# Patient Record
Sex: Male | Born: 1990 | Race: Black or African American | Hispanic: No | Marital: Single | Smoking: Never smoker
Health system: Southern US, Community
[De-identification: ages and names within clinical notes are randomized; demographics above are authoritative.]

## PROBLEM LIST (undated history)

## (undated) HISTORY — PX: DENTAL SURGERY: SHX609

---
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
## 2012-12-15 ENCOUNTER — Encounter (HOSPITAL_COMMUNITY): Payer: Self-pay

## 2012-12-15 ENCOUNTER — Emergency Department (HOSPITAL_COMMUNITY)
Admission: EM | Admit: 2012-12-15 | Discharge: 2012-12-15 | Disposition: A | Discharge status: Home / Self Care | Attending: Emergency Medicine | Admitting: Emergency Medicine

## 2012-12-15 ENCOUNTER — Emergency Department (HOSPITAL_COMMUNITY)

## 2012-12-15 DIAGNOSIS — M7650 Patellar tendinitis, unspecified knee: Secondary | ICD-10-CM | POA: Insufficient documentation

## 2012-12-15 DIAGNOSIS — M7651 Patellar tendinitis, right knee: Secondary | ICD-10-CM

## 2012-12-15 MED ORDER — PREDNISONE 50 MG PO TABS: 50.0000 mg | ORAL_TABLET | Freq: Every day | ORAL | Status: DC

## 2012-12-15 MED ORDER — HYDROCODONE-ACETAMINOPHEN 5-325 MG PO TABS: 1.0000 | ORAL_TABLET | Freq: Four times a day (QID) | ORAL | Status: DC | PRN

## 2012-12-15 NOTE — ED Provider Notes (Signed)
  CSN: [id number]     Arrival date & time 12/15/12  1223 History     First MD Initiated Contact with Patient 12/15/12 1238     Chief Complaint  Patient presents with  . Knee Pain   (Consider location/radiation/quality/duration/timing/severity/associated sxs/prior Treatment) HPI Patient present to the emergency department with right knee pain that has been ongoing for quite a while.  Patient, states, that it's been ongoing off, and on for several months.  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  Patient, states, that he has not take any medications prior to arrival.  Patient denies numbness, or weakness in his extremities.  Patient, states, that he has no fever.  Patient, states the pain is mainly with movement and palpation History reviewed. No pertinent past medical history. History reviewed. No pertinent past surgical history. No family history on file. History  Substance Use Topics  . Smoking status: Never Smoker   . Smokeless tobacco: Not on file  . Alcohol Use: No    Review of Systems All other systems negative except as documented in the HPI. All pertinent positives and negatives as reviewed in the HPI. Allergies  Review of patient's allergies indicates no known allergies.  Home Medications  No current outpatient prescriptions on file. BP 124/72  Pulse 66  Temp(Src) 98.6 F (37 C) (Oral)  Resp 20  SpO2 96% Physical Exam  Nursing note and vitals reviewed. Constitutional: He appears well-developed and well-nourished. No distress.  HENT:  Head: Normocephalic and atraumatic.  Pulmonary/Chest: Effort normal.  Musculoskeletal:       Right knee: He exhibits normal range of motion, no swelling, no effusion, no ecchymosis, no deformity, no erythema and no bony tenderness. Tenderness found. Patellar tendon tenderness noted.       Legs:   ED Course   Procedures (including critical care time)  Labs Reviewed - No data to display Dg Knee Complete 4  Views Right  12/15/2012   *RADIOLOGY REPORT*  Clinical Data: Chronic knee pain, worsening recently.  RIGHT KNEE - COMPLETE 4+ VIEW  Comparison: None.  Findings: There is probably a small amount of joint fluid.  There is no joint space narrowing.  No osteophyte or focal lesion.  IMPRESSION: No osseous or articular pathology evident.  Possible small joint effusion.   Original Report Authenticated By: Paulina Fusi, M.D.    Should be referred to orthopedics, and  return here as needed.  He most likely has patellar tendinitis is advised to ice and elevate his knee MDM    Carlyle Dolly, PA-C 12/15/12 1411

## 2012-12-15 NOTE — ED Notes (Addendum)
Rt. Knee pain denies any injury presently.  Pain is intermittent and sometimes the knee gives out. No swelling or deformity noted.  Pain increases after he runs,  He is in the [employer]

## 2012-12-16 NOTE — ED Provider Notes (Signed)
Medical screening examination/treatment/procedure(s) were performed by non-physician practitioner and as supervising physician I was immediately available for consultation/collaboration.    Celene Kras, MD 12/16/12 2118

## 2014-01-13 ENCOUNTER — Emergency Department (HOSPITAL_COMMUNITY)
Admission: EM | Admit: 2014-01-13 | Discharge: 2014-01-13 | Disposition: A | Discharge status: Home / Self Care | Attending: Emergency Medicine | Admitting: Emergency Medicine

## 2014-01-13 ENCOUNTER — Encounter (HOSPITAL_COMMUNITY): Payer: Self-pay | Admitting: Emergency Medicine

## 2014-01-13 DIAGNOSIS — R51 Headache: Secondary | ICD-10-CM | POA: Diagnosis not present

## 2014-01-13 DIAGNOSIS — J029 Acute pharyngitis, unspecified: Secondary | ICD-10-CM | POA: Diagnosis not present

## 2014-01-13 LAB — RAPID STREP SCREEN (MED CTR MEBANE ONLY): Streptococcus, Group A Screen (Direct): NEGATIVE

## 2014-01-13 MED ORDER — ACETAMINOPHEN 325 MG PO TABS
650.0000 mg | ORAL_TABLET | Freq: Once | ORAL | Status: AC
  Administered 2014-01-13: 650 mg via ORAL
  Filled 2014-01-13: qty 2

## 2014-01-13 MED ORDER — PREDNISONE 50 MG PO TABS
60.0000 mg | ORAL_TABLET | Freq: Once | ORAL | Status: AC
  Administered 2014-01-13: 60 mg via ORAL
  Filled 2014-01-13 (×2): qty 1

## 2014-01-13 NOTE — ED Provider Notes (Signed)
CSN: [id number]     Arrival date & time 01/13/14  0154 History   First MD Initiated Contact with Patient 01/13/14 0206     Chief Complaint  Patient presents with  . Sore Throat      Patient is a 23 y.o. male presenting with pharyngitis. The history is provided by the patient.  Sore Throat This is a new problem. The current episode started yesterday. The problem occurs constantly. The problem has been gradually worsening. Associated symptoms include headaches. The symptoms are aggravated by swallowing. The symptoms are relieved by rest.    PMH - none  History  Substance Use Topics  . Smoking status: Never Smoker   . Smokeless tobacco: Not on file  . Alcohol Use: Yes    Review of Systems  Constitutional: Positive for fever and chills.  Respiratory: Negative for cough.   Gastrointestinal: Negative for vomiting and diarrhea.  Neurological: Positive for headaches.      Allergies  Review of patient's allergies indicates no known allergies.  Home Medications   Prior to Admission medications   Not on File   BP 147/75  Pulse 106  Temp(Src) 103.2 F (39.6 C) (Oral)  Resp 18  Ht 6' (1.829 m)  Wt 210 lb (95.255 kg)  BMI 28.47 kg/m2  SpO2 95% Physical Exam CONSTITUTIONAL: Well developed/well nourished HEAD: Normocephalic/atraumatic EYES: EOMI/PERRL ENMT: Mucous membranes moist. Uvula midline.  Tonsil enlarged.  Exudates noted.  Erythema noted.  Normal phonation.  No stridor.  No drooling NECK: supple no meningeal signs. Cervical lymphadenopathy noted CV: S1/S2 noted, no murmurs/rubs/gallops noted LUNGS: Lungs are clear to auscultation bilaterally, no apparent distress ABDOMEN: soft, nontender, no rebound or guarding NEURO: Pt is awake/alert, moves all extremitiesx4 EXTREMITIES: pulses normal, full ROM SKIN: warm, color normal PSYCH: no abnormalities of mood noted  ED Course  Procedures   Strep negative Pt tolerating PO, no distress noted and he is not toxic  appearing Will treat as viral pharyngitis Appropriate for discharge home  Labs Review Labs Reviewed  RAPID STREP SCREEN  CULTURE, GROUP A STREP      MDM   Final diagnoses:  Pharyngitis    Nursing notes including past medical history and social history reviewed and considered in documentation Labs/vital reviewed and considered     Joya Gaskins, MD 01/13/14 [phone]

## 2014-01-13 NOTE — ED Notes (Signed)
Pt reports a sore throat that started on Friday. Pt states he has felt like he has been warm. Pt unable to sleep.

## 2014-01-15 LAB — CULTURE, GROUP A STREP

## 2014-11-17 ENCOUNTER — Emergency Department (HOSPITAL_COMMUNITY)

## 2014-11-17 ENCOUNTER — Emergency Department (HOSPITAL_COMMUNITY)
Admission: EM | Admit: 2014-11-17 | Discharge: 2014-11-17 | Disposition: A | Discharge status: Home / Self Care | Attending: Emergency Medicine | Admitting: Emergency Medicine

## 2014-11-17 ENCOUNTER — Encounter (HOSPITAL_COMMUNITY): Payer: Self-pay | Admitting: Emergency Medicine

## 2014-11-17 DIAGNOSIS — Y9389 Activity, other specified: Secondary | ICD-10-CM | POA: Insufficient documentation

## 2014-11-17 DIAGNOSIS — X58XXXA Exposure to other specified factors, initial encounter: Secondary | ICD-10-CM | POA: Insufficient documentation

## 2014-11-17 DIAGNOSIS — Y998 Other external cause status: Secondary | ICD-10-CM | POA: Diagnosis not present

## 2014-11-17 DIAGNOSIS — Y9289 Other specified places as the place of occurrence of the external cause: Secondary | ICD-10-CM | POA: Insufficient documentation

## 2014-11-17 DIAGNOSIS — S93401A Sprain of unspecified ligament of right ankle, initial encounter: Secondary | ICD-10-CM | POA: Diagnosis not present

## 2014-11-17 DIAGNOSIS — S99911A Unspecified injury of right ankle, initial encounter: Secondary | ICD-10-CM | POA: Diagnosis present

## 2014-11-17 MED ORDER — ACETAMINOPHEN 500 MG PO TABS
1000.0000 mg | ORAL_TABLET | Freq: Once | ORAL | Status: AC
  Administered 2014-11-17: 1000 mg via ORAL
  Filled 2014-11-17: qty 2

## 2014-11-17 MED ORDER — IBUPROFEN 800 MG PO TABS: 800.0000 mg | ORAL_TABLET | Freq: Three times a day (TID) | ORAL | Status: DC

## 2014-11-17 MED ORDER — KETOROLAC TROMETHAMINE 10 MG PO TABS
10.0000 mg | ORAL_TABLET | Freq: Once | ORAL | Status: AC
  Administered 2014-11-17: 10 mg via ORAL
  Filled 2014-11-17: qty 1

## 2014-11-17 NOTE — ED Notes (Signed)
PT c/o right ankle pain with swelling and bruising after overturning it while working outside yesterday. PT able to bear weight to right ankle.

## 2014-11-17 NOTE — ED Provider Notes (Signed)
CSN: [id number]     Arrival date & time 11/17/14  1748 History   This chart was scribed for Ivery Quale, PA-C working with No att. providers found by Elveria Rising, ED Scribe. This patient was seen in room APFT21/APFT21 and the patient's care was started at 6:36 PM.   Chief Complaint  Patient presents with  . Ankle Injury   The history is provided by the patient. No language interpreter was used.   HPI Comments: Aaron Mccullough is a 24 y.o. male who presents to the Emergency Department complaining of right ankle pain and swelling after everting his ankle when working outside yesterday. Patient reports pain especially with bearing weight and ambulation; alleviation at rest. Patient denies additional injuries from his fall. Patient is not on anticoagulants. Patient denies previous surgeries or procedures to ankle.   History reviewed. No pertinent past medical history. Past Surgical History  Procedure Laterality Date  . Dental surgery     History reviewed. No pertinent family history. History  Substance Use Topics  . Smoking status: Never Smoker   . Smokeless tobacco: Not on file  . Alcohol Use: Yes     Comment: occassionally    Review of Systems  Constitutional: Negative for fever.  Musculoskeletal: Positive for joint swelling and arthralgias.  Skin: Negative for wound.  Neurological: Negative for numbness.  All other systems reviewed and are negative.   Allergies  Review of patient's allergies indicates no known allergies.  Home Medications   Prior to Admission medications   Not on File   Triage Vitals: BP 134/78 mmHg  Pulse 60  Temp(Src) 98.7 F (37.1 C) (Oral)  Resp 24  Ht 6' (1.829 m)  Wt 207 lb (93.895 kg)  BMI 28.07 kg/m2  SpO2 99% Physical Exam  Constitutional: He is oriented to person, place, and time. He appears well-developed and well-nourished. No distress.  HENT:  Head: Normocephalic and atraumatic.  Eyes: EOM are normal.  Neck: Neck supple. No  tracheal deviation present.  Cardiovascular: Normal rate.   Pulmonary/Chest: Effort normal. No respiratory distress.  Musculoskeletal: Normal range of motion.  No deformity of antioer tibial area. Achilles tendon is intact. DP and PT are 2+. Capillary refill is <2 seconds. No effusion of the joint on the right ankle. Good ROM of the knee and hip on the right.   Neurological: He is alert and oriented to person, place, and time.  Skin: Skin is warm and dry.  Psychiatric: He has a normal mood and affect. His behavior is normal.  Nursing note and vitals reviewed.   ED Course  Procedures (including critical care time)  COORDINATION OF CARE: 6:44 PM- Will apply stirrup splint to be used over next 10-14 days. Ice, elevation, ibuprofen recommended. Discussed treatment plan with patient at bedside and patient agreed to plan.   Labs Review Labs Reviewed - No data to display  Imaging Review No results found.   EKG Interpretation None      MDM  X-ray of the right ankle is negative for fracture or dislocation. There no neurovascular compromise appreciated. The examination favors an ankle sprain. The patient is fitted with an ankle stirrup splint. He is asked to use ibuprofen 800 mg and extra strength Tylenol 3 times daily. He is also advised to use ice and elevation. He is to see his primary physician, or return to the emergency department if any changes, problems, or concerns.    Final diagnoses:  None    *I have reviewed nursing  notes, vital signs, and all appropriate lab and imaging results for this patient.**  **I personally performed the services described in this documentation, which was scribed in my presence. The recorded information has been reviewed and is accurate.Ivery Quale, PA-C 11/17/14 1853  Eber Hong, MD 11/18/14 [phone]

## 2014-11-17 NOTE — Discharge Instructions (Signed)
Your x-ray is negative for fracture or dislocation. Please use ibuprofen 800 mg, and extra strength Tylenol 3 times daily with food for the next 5-7 days. Please use your ankle stirrup splint for the next 10 days or 2 weeks. Please apply ice and elevate your ankle is much as possible. Ankle Sprain An ankle sprain is an injury to the strong, fibrous tissues (ligaments) that hold the bones of your ankle joint together.  CAUSES An ankle sprain is usually caused by a fall or by twisting your ankle. Ankle sprains most commonly occur when you step on the outer edge of your foot, and your ankle turns inward. People who participate in sports are more prone to these types of injuries.  SYMPTOMS   Pain in your ankle. The pain may be present at rest or only when you are trying to stand or walk.  Swelling.  Bruising. Bruising may develop immediately or within 1 to 2 days after your injury.  Difficulty standing or walking, particularly when turning corners or changing directions. DIAGNOSIS  Your caregiver will ask you details about your injury and perform a physical exam of your ankle to determine if you have an ankle sprain. During the physical exam, your caregiver will press on and apply pressure to specific areas of your foot and ankle. Your caregiver will try to move your ankle in certain ways. An X-ray exam may be done to be sure a bone was not broken or a ligament did not separate from one of the bones in your ankle (avulsion fracture).  TREATMENT  Certain types of braces can help stabilize your ankle. Your caregiver can make a recommendation for this. Your caregiver may recommend the use of medicine for pain. If your sprain is severe, your caregiver may refer you to a surgeon who helps to restore function to parts of your skeletal system (orthopedist) or a physical therapist. HOME CARE INSTRUCTIONS   Apply ice to your injury for 1-2 days or as directed by your caregiver. Applying ice helps to reduce  inflammation and pain.  Put ice in a plastic bag.  Place a towel between your skin and the bag.  Leave the ice on for 15-20 minutes at a time, every 2 hours while you are awake.  Only take over-the-counter or prescription medicines for pain, discomfort, or fever as directed by your caregiver.  Elevate your injured ankle above the level of your heart as much as possible for 2-3 days.  If your caregiver recommends crutches, use them as instructed. Gradually put weight on the affected ankle. Continue to use crutches or a cane until you can walk without feeling pain in your ankle.  If you have a plaster splint, wear the splint as directed by your caregiver. Do not rest it on anything harder than a pillow for the first 24 hours. Do not put weight on it. Do not get it wet. You may take it off to take a shower or bath.  You may have been given an elastic bandage to wear around your ankle to provide support. If the elastic bandage is too tight (you have numbness or tingling in your foot or your foot becomes cold and blue), adjust the bandage to make it comfortable.  If you have an air splint, you may blow more air into it or let air out to make it more comfortable. You may take your splint off at night and before taking a shower or bath. Wiggle your toes in the splint several  times per day to decrease swelling. SEEK MEDICAL CARE IF:   You have rapidly increasing bruising or swelling.  Your toes feel extremely cold or you lose feeling in your foot.  Your pain is not relieved with medicine. SEEK IMMEDIATE MEDICAL CARE IF:  Your toes are numb or blue.  You have severe pain that is increasing. MAKE SURE YOU:   Understand these instructions.  Will watch your condition.  Will get help right away if you are not doing well or get worse. Document Released: 04/12/2005 Document Revised: 01/05/2012 Document Reviewed: 04/24/2011 [facility] Patient Information 2015 [place], [place]. This information is  not intended to replace advice given to you by your health care provider. Make sure you discuss any questions you have with your health care provider.

## 2014-11-17 NOTE — ED Notes (Signed)
Patient with no complaints at this time. Respirations even and unlabored. Skin warm/dry. Discharge instructions reviewed with patient at this time. Patient given opportunity to voice concerns/ask questions. Patient discharged at this time and left Emergency Department with steady gait.   

## 2015-06-09 ENCOUNTER — Emergency Department (HOSPITAL_COMMUNITY)
Admission: EM | Admit: 2015-06-09 | Discharge: 2015-06-09 | Disposition: A | Discharge status: Home / Self Care | Attending: Emergency Medicine | Admitting: Emergency Medicine

## 2015-06-09 ENCOUNTER — Encounter (HOSPITAL_COMMUNITY): Payer: Self-pay | Admitting: *Deleted

## 2015-06-09 DIAGNOSIS — B9789 Other viral agents as the cause of diseases classified elsewhere: Secondary | ICD-10-CM

## 2015-06-09 DIAGNOSIS — M791 Myalgia: Secondary | ICD-10-CM | POA: Diagnosis not present

## 2015-06-09 DIAGNOSIS — J029 Acute pharyngitis, unspecified: Secondary | ICD-10-CM | POA: Insufficient documentation

## 2015-06-09 DIAGNOSIS — J028 Acute pharyngitis due to other specified organisms: Secondary | ICD-10-CM

## 2015-06-09 LAB — RAPID STREP SCREEN (MED CTR MEBANE ONLY): Streptococcus, Group A Screen (Direct): NEGATIVE

## 2015-06-09 MED ORDER — LIDOCAINE VISCOUS 2 % MT SOLN: 10.0000 mL | OROMUCOSAL | Status: AC | PRN

## 2015-06-09 MED ORDER — IBUPROFEN 100 MG/5ML PO SUSP: 800.0000 mg | Freq: Once | ORAL | Status: DC

## 2015-06-09 MED ORDER — IBUPROFEN 800 MG PO TABS
ORAL_TABLET | ORAL | Status: AC
  Administered 2015-06-09: 800 mg via ORAL
  Filled 2015-06-09: qty 1

## 2015-06-09 MED ORDER — IBUPROFEN 800 MG PO TABS
800.0000 mg | ORAL_TABLET | Freq: Once | ORAL | Status: AC
  Administered 2015-06-09: 800 mg via ORAL

## 2015-06-09 MED ORDER — ACETAMINOPHEN 325 MG PO TABS
650.0000 mg | ORAL_TABLET | Freq: Once | ORAL | Status: DC
Start: 2015-06-09 — End: 2015-06-10
  Filled 2015-06-09 (×2): qty 2

## 2015-06-09 NOTE — Discharge Instructions (Signed)

## 2015-06-09 NOTE — ED Notes (Signed)
Pt complaining of throat pain and temp 100, PA aware, orders given

## 2015-06-09 NOTE — ED Notes (Addendum)
Pt  States hewas given tylenol in traige, not ordered or documented in chart. Pt drinking ice water, will wait to recheck vitals

## 2015-06-09 NOTE — ED Notes (Signed)
Patient given discharge instruction, verbalized understand. Patient ambulatory out of the department.  

## 2015-06-09 NOTE — ED Notes (Signed)
Pt comes in with sore throat and nasal congestion starting this morning. Denies n/v/d.

## 2015-06-09 NOTE — ED Notes (Signed)
Headache, sore throat, cough

## 2015-06-10 ENCOUNTER — Emergency Department (HOSPITAL_COMMUNITY)

## 2015-06-10 ENCOUNTER — Encounter (HOSPITAL_COMMUNITY): Payer: Self-pay | Admitting: *Deleted

## 2015-06-10 ENCOUNTER — Emergency Department (HOSPITAL_COMMUNITY)
Admission: EM | Admit: 2015-06-10 | Discharge: 2015-06-11 | Disposition: A | Discharge status: Home / Self Care | Attending: Emergency Medicine | Admitting: Emergency Medicine

## 2015-06-10 DIAGNOSIS — J159 Unspecified bacterial pneumonia: Secondary | ICD-10-CM | POA: Insufficient documentation

## 2015-06-10 DIAGNOSIS — J189 Pneumonia, unspecified organism: Secondary | ICD-10-CM

## 2015-06-10 DIAGNOSIS — J029 Acute pharyngitis, unspecified: Secondary | ICD-10-CM | POA: Diagnosis present

## 2015-06-10 LAB — BASIC METABOLIC PANEL
Anion gap: 8 (ref 5–15)
BUN: 15 mg/dL (ref 6–20)
CHLORIDE: 103 mmol/L (ref 101–111)
CO2: 26 mmol/L (ref 22–32)
Calcium: 9 mg/dL (ref 8.9–10.3)
Creatinine, Ser: 1.28 mg/dL — ABNORMAL HIGH (ref 0.61–1.24)
GFR calc Af Amer: >60 mL/min (ref >60)
GLUCOSE: 108 mg/dL — AB (ref 65–99)
POTASSIUM: 3.2 mmol/L — AB (ref 3.5–5.1)
Sodium: 137 mmol/L (ref 135–145)

## 2015-06-10 LAB — CBC WITH DIFFERENTIAL/PLATELET
Basophils Absolute: 0 10*3/uL (ref 0.0–0.1)
Basophils Relative: 0 %
EOS PCT: 1 %
Eosinophils Absolute: 0.1 10*3/uL (ref 0.0–0.7)
HCT: 40.2 % (ref 39.0–52.0)
Hemoglobin: 12.9 g/dL — ABNORMAL LOW (ref 13.0–17.0)
LYMPHS ABS: 0.9 10*3/uL (ref 0.7–4.0)
LYMPHS PCT: 9 %
MCH: 30.4 pg (ref 26.0–34.0)
MCHC: 32.1 g/dL (ref 30.0–36.0)
MCV: 94.8 fL (ref 78.0–100.0)
MONO ABS: 1.2 10*3/uL — AB (ref 0.1–1.0)
MONOS PCT: 12 %
Neutro Abs: 7.9 10*3/uL — ABNORMAL HIGH (ref 1.7–7.7)
Neutrophils Relative %: 78 %
PLATELETS: 229 10*3/uL (ref 150–400)
RBC: 4.24 MIL/uL (ref 4.22–5.81)
RDW: 12.5 % (ref 11.5–15.5)
WBC: 10.1 10*3/uL (ref 4.0–10.5)

## 2015-06-10 LAB — MONONUCLEOSIS SCREEN: Mono Screen: NEGATIVE

## 2015-06-10 LAB — LACTIC ACID, PLASMA: Lactic Acid, Venous: 0.8 mmol/L (ref 0.5–2.0)

## 2015-06-10 LAB — RAPID STREP SCREEN (MED CTR MEBANE ONLY): Streptococcus, Group A Screen (Direct): NEGATIVE

## 2015-06-10 MED ORDER — DEXTROSE 5 % IV SOLN
1.0000 g | Freq: Once | INTRAVENOUS | Status: AC
  Administered 2015-06-10: 1 g via INTRAVENOUS
  Filled 2015-06-10: qty 10

## 2015-06-10 MED ORDER — SODIUM CHLORIDE 0.9 % IV BOLUS (SEPSIS)
1000.0000 mL | Freq: Once | INTRAVENOUS | Status: AC
  Administered 2015-06-10: 1000 mL via INTRAVENOUS

## 2015-06-10 MED ORDER — GI COCKTAIL ~~LOC~~
30.0000 mL | Freq: Once | ORAL | Status: AC
  Administered 2015-06-10: 30 mL via ORAL
  Filled 2015-06-10: qty 30

## 2015-06-10 MED ORDER — ACETAMINOPHEN 325 MG PO TABS
650.0000 mg | ORAL_TABLET | Freq: Once | ORAL | Status: AC | PRN
  Administered 2015-06-10: 650 mg via ORAL
  Filled 2015-06-10: qty 2

## 2015-06-10 MED ORDER — POTASSIUM CHLORIDE CRYS ER 20 MEQ PO TBCR
20.0000 meq | EXTENDED_RELEASE_TABLET | Freq: Once | ORAL | Status: AC
  Administered 2015-06-10: 20 meq via ORAL
  Filled 2015-06-10: qty 1

## 2015-06-10 NOTE — ED Provider Notes (Signed)
CSN: [id number]     Arrival date & time 06/09/15  1840 History   First MD Initiated Contact with Patient 06/09/15 1952     Chief Complaint  Patient presents with  . Sore Throat     (Consider location/radiation/quality/duration/timing/severity/associated sxs/prior Treatment) The history is provided by the patient.   Aaron Mccullough is a 25 y.o. male presenting with a 1 day history of subjective fever including sweats followed by cold chills, sore throat along with generalized body ache and headache.  He has taken ibuprofen with moderate improvement in pain and fever symptoms.  He denies nasal congestion, ear or sinus pain, shortness of breath, chest pain, cough, nausea, vomiting, diarrhea or abdominal pain.  He has not been exposed to anyone with strep throat to his knowledge.  He received a flu shot this fall through the [employer].    History reviewed. No pertinent past medical history. Past Surgical History  Procedure Laterality Date  . Dental surgery     No family history on file. Social History  Substance Use Topics  . Smoking status: Never Smoker   . Smokeless tobacco: None  . Alcohol Use: Yes     Comment: occassionally    Review of Systems  Constitutional: Positive for fever and chills.  HENT: Positive for sore throat. Negative for congestion, ear pain, rhinorrhea, sinus pressure, trouble swallowing and voice change.   Eyes: Negative for discharge.  Respiratory: Negative for cough, shortness of breath, wheezing and stridor.   Cardiovascular: Negative for chest pain.  Gastrointestinal: Negative for abdominal pain.  Genitourinary: Negative.   Musculoskeletal: Positive for myalgias.  Skin: Negative.       Allergies  Review of patient's allergies indicates no known allergies.  Home Medications   Prior to Admission medications   Medication Sig Start Date End Date Taking? Authorizing Provider  ibuprofen (ADVIL,MOTRIN) 800 MG tablet Take 800 mg by mouth every 8 (eight)  hours as needed. 05/21/15   Historical Provider, MD  lidocaine (XYLOCAINE) 2 % solution Use as directed 10 mLs in the mouth or throat every 3 (three) hours as needed (gargle and spit as needed for mouth or throat pain). 06/09/15   Burgess Amor, PA-C  MAXALT-MLT 10 MG disintegrating tablet Take 10 mg by mouth as needed for migraine.  05/14/15   Historical Provider, MD  traZODone (DESYREL) 50 MG tablet Take 50 mg by mouth at bedtime. 05/14/15   Historical Provider, MD  zolpidem (AMBIEN) 5 MG tablet Take 5 mg by mouth at bedtime. 05/14/15   Historical Provider, MD   BP 154/68 mmHg  Pulse 93  Temp(Src) 100 F (37.8 C) (Oral)  Resp 14  Ht  (1.803 m)  Wt 102.059 kg  BMI 31.39 kg/m2  SpO2 98% Physical Exam  Constitutional: He is oriented to person, place, and time. He appears well-developed and well-nourished.  HENT:  Head: Normocephalic and atraumatic.  Right Ear: Tympanic membrane and ear canal normal.  Left Ear: Tympanic membrane and ear canal normal.  Nose: No mucosal edema or rhinorrhea.  Mouth/Throat: Uvula is midline and mucous membranes are normal. Posterior oropharyngeal erythema present. No oropharyngeal exudate, posterior oropharyngeal edema or tonsillar abscesses.  Eyes: Conjunctivae are normal.  Cardiovascular: Normal rate and normal heart sounds.   Pulmonary/Chest: Effort normal. No respiratory distress. He has no wheezes. He has no rales.  Abdominal: Soft. There is no tenderness.  Musculoskeletal: Normal range of motion.  Neurological: He is alert and oriented to person, place, and time.  Skin: Skin  is warm and dry. No rash noted.  Psychiatric: He has a normal mood and affect.    ED Course  Procedures (including critical care time) Labs Review Labs Reviewed  RAPID STREP SCREEN (NOT AT [facility])  CULTURE, GROUP A STREP [facility])    Imaging Review  I have personally reviewed and evaluated these images and lab results as part of my medical decision-making.   EKG  Interpretation None      MDM   Final diagnoses:  Acute viral pharyngitis    Discussed results with pt, aware culture pending. Also discussed influenza as possible source of sx.  Advised increased fluid intake, motrin/tylenol for throat pain and fever reduction.  Rest. Prn f/u.     Burgess Amor, PA-C 06/10/15 2329  Bethann Berkshire, MD 06/10/15 [phone]

## 2015-06-10 NOTE — ED Notes (Signed)
Pt c/o sore throat x 2 days; pt states he was seen here and was given tylenol;

## 2015-06-11 LAB — CULTURE, GROUP A STREP (THRC)

## 2015-06-11 MED ORDER — AZITHROMYCIN 250 MG PO TABS: ORAL_TABLET | ORAL | Status: AC

## 2015-06-11 NOTE — Discharge Instructions (Signed)

## 2015-06-11 NOTE — ED Provider Notes (Signed)
CSN: [id number]     Arrival date & time 06/10/15  1948 History   First MD Initiated Contact with Patient 06/10/15 2112     Chief Complaint  Patient presents with  . Sore Throat     (Consider location/radiation/quality/duration/timing/severity/associated sxs/prior Treatment) The history is provided by the patient.   Aaron Mccullough is a 25 y.o. male who was seen here yesterday for fever and sore throat returns today for worsened symptoms including worsened sore throat with sensation of fullness with swallowing, fever currently 102.5 (has had no antipyretics today per girlfriend at the bedside), persistent generalized myalgia and fatigue.  He also endorses generalized headache.  He has had no cough, shortness of breath, chest pain, nausea, vomiting, diarrhea or abdominal pain. He has been able to maintain hydration but has had no appetite felt to be related to the pain of swallowing.  He tried using the lidocaine prescribed yesterday for throat pain which was not helpful.  He denies any known exposures to similar illness.  He has received a flu shot this season, requirement of being in the [employer].      History reviewed. No pertinent past medical history. Past Surgical History  Procedure Laterality Date  . Dental surgery     History reviewed. No pertinent family history. Social History  Substance Use Topics  . Smoking status: Never Smoker   . Smokeless tobacco: None  . Alcohol Use: Yes     Comment: occassionally    Review of Systems  Constitutional: Positive for fever, chills and fatigue.  HENT: Positive for sore throat. Negative for congestion.   Eyes: Negative.   Respiratory: Negative for cough, chest tightness, shortness of breath and wheezing.   Cardiovascular: Negative for chest pain.  Gastrointestinal: Negative for nausea, vomiting and abdominal pain.  Genitourinary: Negative.  Negative for dysuria and decreased urine volume.  Musculoskeletal: Positive for myalgias.  Negative for joint swelling, arthralgias and neck pain.  Skin: Negative.  Negative for rash and wound.  Neurological: Negative for dizziness, weakness, light-headedness, numbness and headaches.  Psychiatric/Behavioral: Negative.       Allergies  Review of patient's allergies indicates no known allergies.  Home Medications   Prior to Admission medications   Medication Sig Start Date End Date Taking? Authorizing Provider  ibuprofen (ADVIL,MOTRIN) 800 MG tablet Take 800 mg by mouth every 8 (eight) hours as needed. 05/21/15  Yes Historical Provider, MD  lidocaine (XYLOCAINE) 2 % solution Use as directed 10 mLs in the mouth or throat every 3 (three) hours as needed (gargle and spit as needed for mouth or throat pain). 06/09/15  Yes Raynelle Mccullough Patric Buckhalter, PA-C  MAXALT-MLT 10 MG disintegrating tablet Take 10 mg by mouth as needed for migraine.  05/14/15  Yes Historical Provider, MD  traZODone (DESYREL) 50 MG tablet Take 50 mg by mouth at bedtime. 05/14/15  Yes Historical Provider, MD  zolpidem (AMBIEN) 5 MG tablet Take 5 mg by mouth at bedtime. 05/14/15  Yes Historical Provider, MD  azithromycin (ZITHROMAX Z-PAK) 250 MG tablet Take 2 tablets by mouth on day one followed by one tablet daily for 4 days. 06/11/15   Burgess Amor, PA-C   BP 151/72 mmHg  Pulse 99  Temp(Src) 100.4 F (38 C) (Oral)  Resp 18  Ht 6' (1.829 m)  Wt 102.059 kg  BMI 30.51 kg/m2  SpO2 98% Physical Exam  Constitutional: He appears well-developed and well-nourished.  HENT:  Head: Normocephalic and atraumatic.  Right Ear: Tympanic membrane and ear canal normal.  Left Ear: Tympanic membrane and ear canal normal.  Nose: No mucosal edema or rhinorrhea.  Mouth/Throat: No trismus in the jaw. Posterior oropharyngeal erythema present.  Mild posterior pharyngeal erythema.  No exudate, no edema, uvula midline.  Eyes: Conjunctivae are normal.  Neck: Normal range of motion. Neck supple.  Cardiovascular: Normal rate, regular rhythm, normal heart  sounds and intact distal pulses.   Pulmonary/Chest: Effort normal and breath sounds normal. No respiratory distress. He has no wheezes.  Abdominal: Soft. Bowel sounds are normal. There is no tenderness. There is no guarding.  Musculoskeletal: Normal range of motion.  Lymphadenopathy:    He has no cervical adenopathy.  Neurological: He is alert.  Skin: Skin is warm and dry.  Psychiatric: He has a normal mood and affect.  Nursing note and vitals reviewed.   ED Course  Procedures (including critical care time) Labs Review Labs Reviewed  CBC WITH DIFFERENTIAL/PLATELET - Abnormal; Notable for the following:    Hemoglobin 12.9 (*)    Neutro Abs 7.9 (*)    Monocytes Absolute 1.2 (*)    All other components within normal limits  BASIC METABOLIC PANEL - Abnormal; Notable for the following:    Potassium 3.2 (*)    Glucose, Bld 108 (*)    Creatinine, Ser 1.28 (*)    All other components within normal limits  RAPID STREP SCREEN (NOT AT [facility])  CULTURE, GROUP A STREP ([facility])  LACTIC ACID, PLASMA  MONONUCLEOSIS SCREEN  URINALYSIS, ROUTINE W REFLEX MICROSCOPIC (NOT AT [facility])    Imaging Review Dg Neck Soft Tissue  06/10/2015  CLINICAL DATA:  25 year old male with fever, weakness, body aches and sore throat EXAM: NECK SOFT TISSUES - 1+ VIEW COMPARISON:  Concurrently obtained chest x-ray. FINDINGS: There is no evidence of retropharyngeal soft tissue swelling or epiglottic enlargement. The cervical airway is unremarkable and no radio-opaque foreign body identified. IMPRESSION: Negative. Electronically Signed   By: Malachy Moan M.D.   On: 06/10/2015 22:11   Dg Chest 2 View  06/10/2015  CLINICAL DATA:  25 year old male with fever, weakness, and body aches EXAM: CHEST  2 VIEW COMPARISON:  Prior chest x-ray 12/28/2009 FINDINGS: Faint patchy airspace opacity in the right middle lobe concerning for bronchopneumonia. The heart and mediastinal contours are within normal limits. No pleural effusion,  pulmonary edema or pneumothorax. No acute osseous abnormality. IMPRESSION: Right middle lobe bronchopneumonia. Electronically Signed   By: Malachy Moan M.D.   On: 06/10/2015 22:10   I have personally reviewed and evaluated these images and lab results as part of my medical decision-making.   EKG Interpretation None      MDM   Final diagnoses:  CAP (community acquired pneumonia)   Medications  acetaminophen (TYLENOL) tablet 650 mg (650 mg Oral Given 06/10/15 2017)  sodium chloride 0.9 % bolus 1,000 mL (0 mLs Intravenous Stopped 06/10/15 2251)  cefTRIAXone (ROCEPHIN) 1 g in dextrose 5 % 50 mL IVPB (0 g Intravenous Stopped 06/11/15 0040)  potassium chloride SA (K-DUR,KLOR-CON) CR tablet 20 mEq (20 mEq Oral Given 06/10/15 2254)  gi cocktail (Maalox,Lidocaine,Donnatal) (30 mLs Oral Given 06/10/15 2254)  sodium chloride 0.9 % bolus 1,000 mL (0 mLs Intravenous Stopped 06/11/15 0040)   Pt given tylenol with adequate fever response, GI cocktail with transient relief of sore throat.  Pt given rocephin IV after review of cxr.  Lungs still CTAB. NS 2 liters given , pt tolerated PO intake as well and felt improved at dc.  Discussed need for better control of  fever which will help him feel better as this illness is running its course. Discussed alternating tylenol/motrin q3hours prn.  Increased fluid intake, rest. Prn f//u here for any worsened sx including sob or weakness.  Zpack prescribed.    Discussed with Dr Estell Harpin prior to dc home.    Burgess Amor, PA-C 06/11/15 0105  Bethann Berkshire, MD 06/11/15 [phone]

## 2015-06-13 LAB — CULTURE, GROUP A STREP (THRC)

## 2017-05-11 IMAGING — DX DG CHEST 2V
2 series · 2 of 2 positions shown · non-contrast
Comparison: Prior chest x-ray 12/28/2009

CLINICAL DATA: 24-year-old male with fever, weakness, and body
aches

EXAM:
CHEST  2 VIEW

[chest pa]
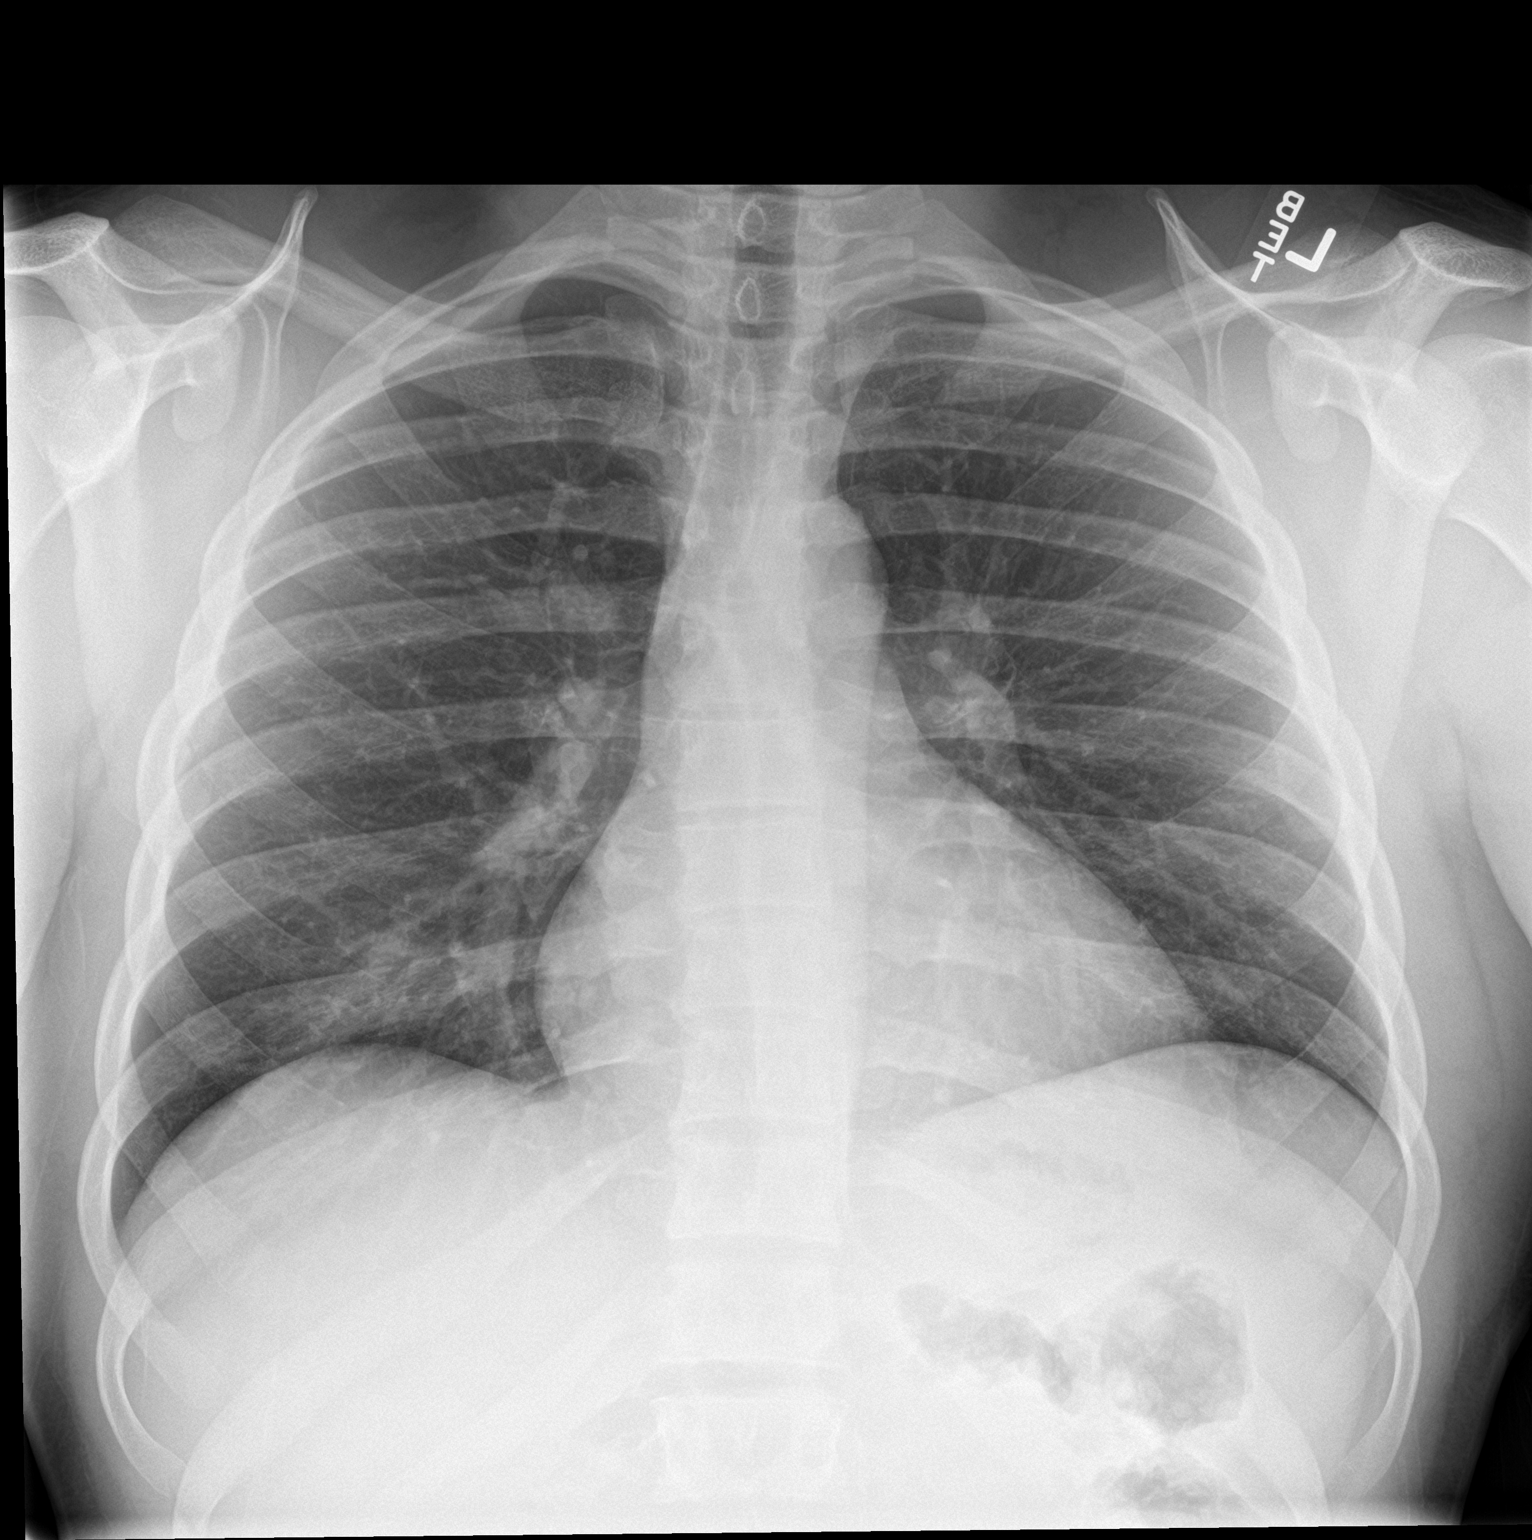

[chest lat]
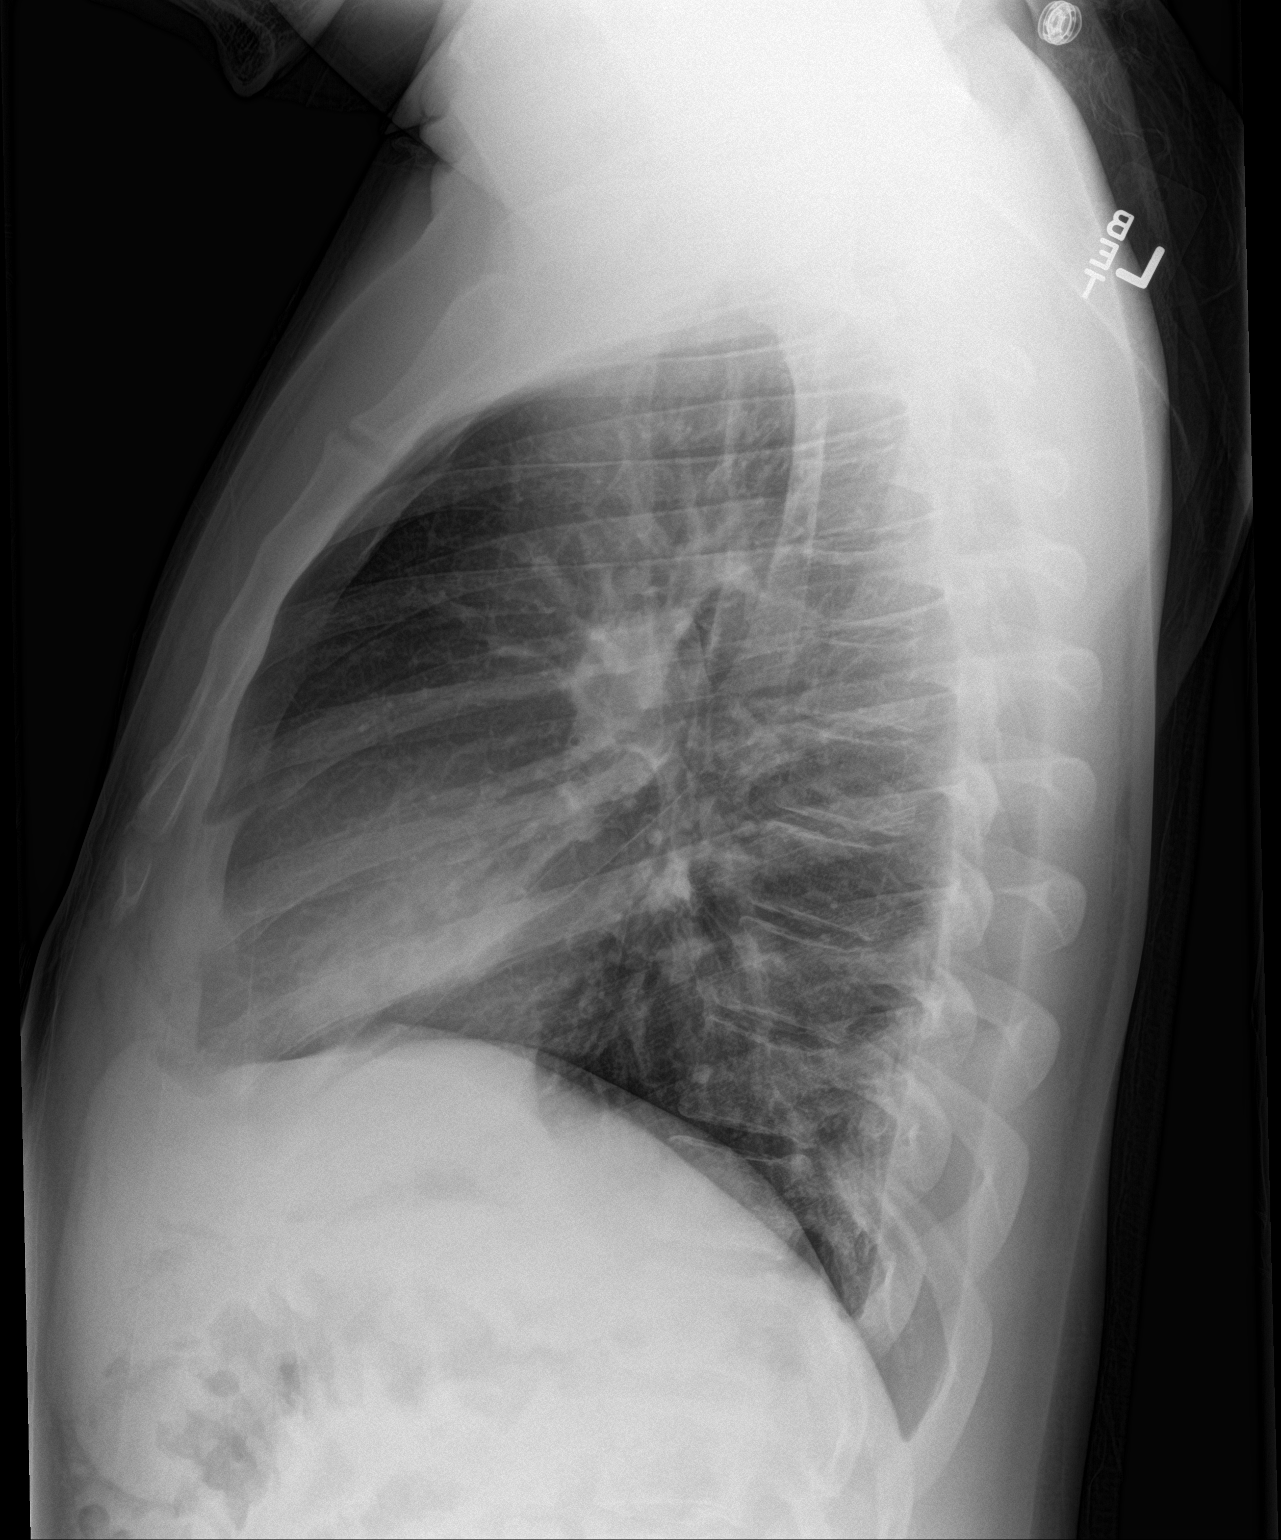

[2 of 2 positions shown; findings below may reference images not displayed]

FINDINGS: Faint patchy airspace opacity in the right middle lobe concerning
for bronchopneumonia. The heart and mediastinal contours are within
normal limits. No pleural effusion, pulmonary edema or pneumothorax.
No acute osseous abnormality.
IMPRESSION: Right middle lobe bronchopneumonia.
